# Patient Record
Sex: Female | Born: 1950 | Race: Black or African American | Hispanic: No | Marital: Married
Health system: Southern US, Community
[De-identification: ages and names within clinical notes are randomized; demographics above are authoritative.]

---

## 2004-08-03 ENCOUNTER — Ambulatory Visit (HOSPITAL_COMMUNITY): Admission: RE | Admit: 2004-08-03 | Discharge: 2004-08-03 | Payer: Self-pay | Admitting: Neurology

## 2004-08-13 ENCOUNTER — Inpatient Hospital Stay (HOSPITAL_COMMUNITY): Admission: RE | Admit: 2004-08-13 | Discharge: 2004-08-14 | Payer: Self-pay | Admitting: Specialist

## 2004-11-10 ENCOUNTER — Emergency Department (HOSPITAL_COMMUNITY): Admission: EM | Admit: 2004-11-10 | Discharge: 2004-11-10 | Payer: Self-pay | Admitting: Emergency Medicine

## 2006-01-29 ENCOUNTER — Emergency Department: Payer: Self-pay | Admitting: Emergency Medicine

## 2006-01-29 ENCOUNTER — Other Ambulatory Visit: Payer: Self-pay

## 2006-05-24 ENCOUNTER — Other Ambulatory Visit: Payer: Self-pay

## 2006-05-24 ENCOUNTER — Emergency Department: Payer: Self-pay | Admitting: Emergency Medicine

## 2006-10-15 ENCOUNTER — Emergency Department: Payer: Self-pay | Admitting: Emergency Medicine

## 2006-10-15 ENCOUNTER — Other Ambulatory Visit: Payer: Self-pay

## 2011-10-11 ENCOUNTER — Observation Stay: Payer: Self-pay | Admitting: Internal Medicine

## 2011-10-11 LAB — COMPREHENSIVE METABOLIC PANEL
Albumin: 3.4 g/dL (ref 3.4–5.0)
Alkaline Phosphatase: 89 U/L (ref 50–136)
Anion Gap: 9 (ref 7–16)
BUN: 9 mg/dL (ref 7–18)
Bilirubin,Total: 0.3 mg/dL (ref 0.2–1.0)
Calcium, Total: 8.3 mg/dL — ABNORMAL LOW (ref 8.5–10.1)
Chloride: 107 mmol/L (ref 98–107)
Co2: 25 mmol/L (ref 21–32)
Creatinine: 0.95 mg/dL (ref 0.60–1.30)
EGFR (African American): >60
EGFR (Non-African Amer.): >60
Glucose: 85 mg/dL (ref 65–99)
Osmolality: 279 (ref 275–301)
Potassium: 3.4 mmol/L — ABNORMAL LOW (ref 3.5–5.1)
SGOT(AST): 40 U/L — ABNORMAL HIGH (ref 15–37)
SGPT (ALT): 60 U/L
Sodium: 141 mmol/L (ref 136–145)
Total Protein: 7 g/dL (ref 6.4–8.2)

## 2011-10-11 LAB — CBC
HCT: 44 % (ref 35.0–47.0)
HGB: 13.9 g/dL (ref 12.0–16.0)
MCH: 29 pg (ref 26.0–34.0)
MCHC: 31.6 g/dL — ABNORMAL LOW (ref 32.0–36.0)
MCV: 92 fL (ref 80–100)
Platelet: 258 10*3/uL (ref 150–440)
RBC: 4.8 10*6/uL (ref 3.80–5.20)
RDW: 13.4 % (ref 11.5–14.5)
WBC: 14.1 10*3/uL — ABNORMAL HIGH (ref 3.6–11.0)

## 2011-10-11 LAB — URINALYSIS, COMPLETE
Bacteria: NONE SEEN
Ph: 6 (ref 4.5–8.0)
Protein: NEGATIVE
RBC,UR: 1 /HPF (ref 0–5)
Specific Gravity: 1.018 (ref 1.003–1.030)
WBC UR: 4 /HPF (ref 0–5)

## 2011-10-11 LAB — APTT: Activated PTT: 28 s

## 2011-10-11 LAB — PROTIME-INR: INR: 0.9

## 2011-10-12 LAB — MAGNESIUM: Magnesium: 2 mg/dL

## 2011-10-13 LAB — BASIC METABOLIC PANEL
Anion Gap: 9 (ref 7–16)
Calcium, Total: 9.2 mg/dL (ref 8.5–10.1)
Chloride: 103 mmol/L (ref 98–107)
EGFR (African American): >60
EGFR (Non-African Amer.): >60
Glucose: 91 mg/dL (ref 65–99)
Osmolality: 275 (ref 275–301)
Potassium: 3.5 mmol/L (ref 3.5–5.1)

## 2011-10-13 LAB — CBC WITH DIFFERENTIAL/PLATELET
Basophil #: 0 10*3/uL (ref 0.0–0.1)
Eosinophil #: 0.1 10*3/uL (ref 0.0–0.7)
Lymphocyte #: 2.3 10*3/uL (ref 1.0–3.6)
MCH: 30.4 pg (ref 26.0–34.0)
MCHC: 33.3 g/dL (ref 32.0–36.0)
Monocyte #: 0.6 x10 3/mm (ref 0.2–0.9)
Monocyte %: 7.8 %
Neutrophil #: 4.6 10*3/uL (ref 1.4–6.5)
Neutrophil %: 60 %
Platelet: 240 10*3/uL (ref 150–440)
RDW: 13 % (ref 11.5–14.5)

## 2011-10-13 LAB — MAGNESIUM: Magnesium: 1.9 mg/dL

## 2011-12-12 ENCOUNTER — Emergency Department: Payer: Self-pay | Admitting: Emergency Medicine

## 2013-06-20 DEATH — deceased

## 2013-11-07 IMAGING — CT CT HEAD WITHOUT CONTRAST
3 of 4 series · 17 of 30 positions shown, 19 images · non-contrast
Comparison: none

REASON FOR EXAM: seizure
COMMENTS:

PROCEDURE:     CT  - CT HEAD WITHOUT CONTRAST  - October 11, 2011  [DATE]
RESULT:     Head CT dated 10/11/2011 comparison made to prior study dated
05/24/2006.
TECHNIQUE: Helical noncontrasted 5 mm sections were obtained from skull base
to the vertex.

[Series 11: without 3 · axial · non-contrast · 0.46mm/px · z∈[-201,-81]mm · 5 of 36 slices shown (1 of 2)]
[im 6/36  brain]
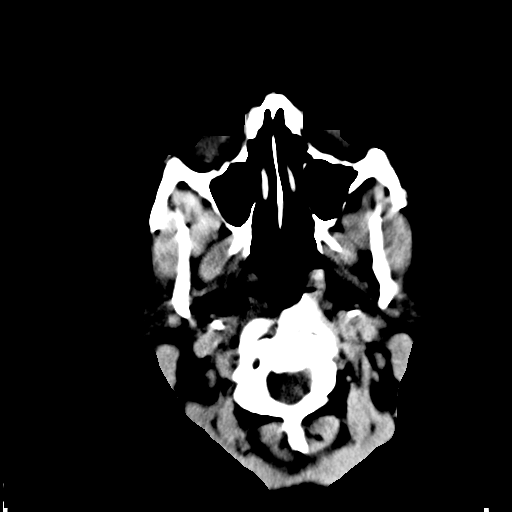
[im 12/36  brain]
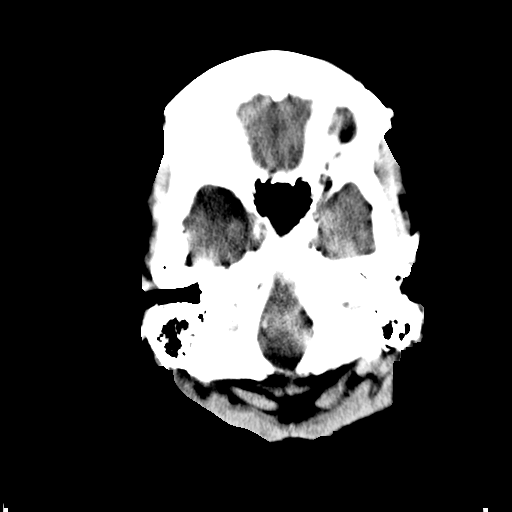
[im 18/36  brain]
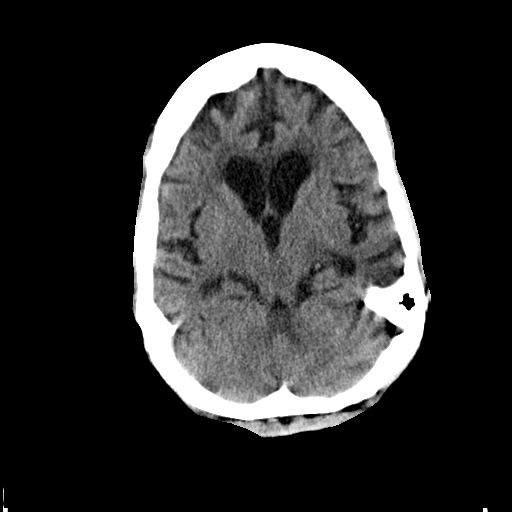
[im 24/36  brain]
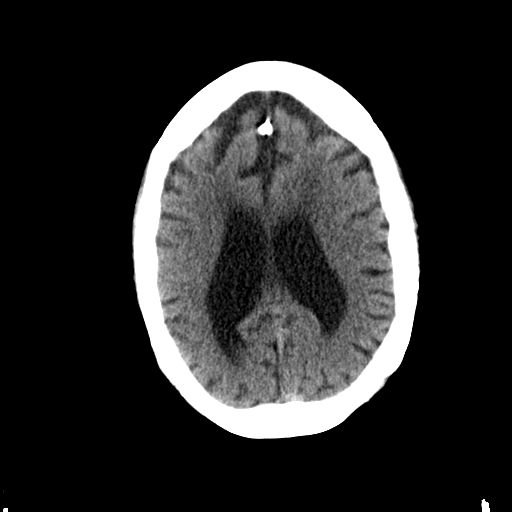
[im 30/36  brain]
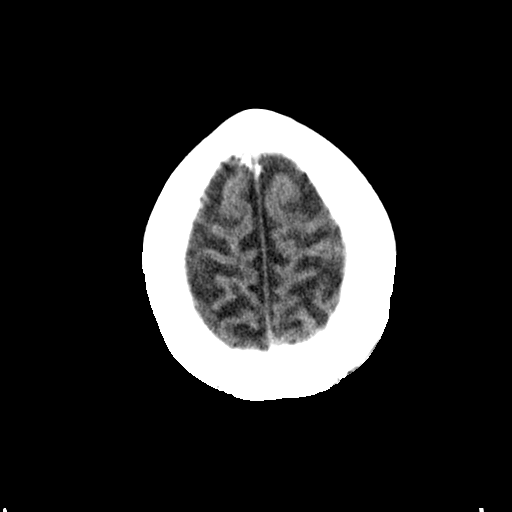

[Series 12: without 3 · axial · non-contrast · 0.46mm/px · z∈[-201,-76]mm · 6 of 36 slices shown, 8 images (2 of 2)]
[im 6/36  brain]
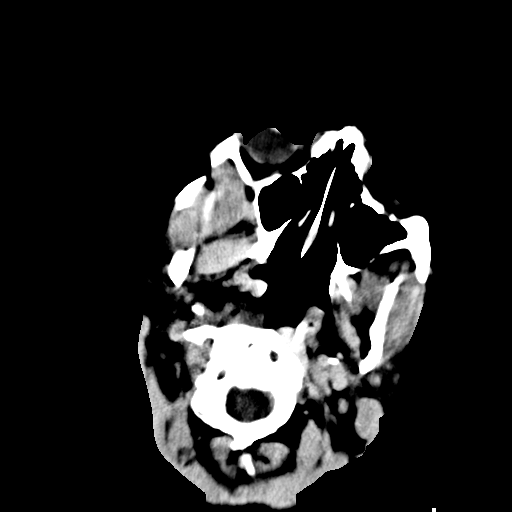
[im 6/36  bone]
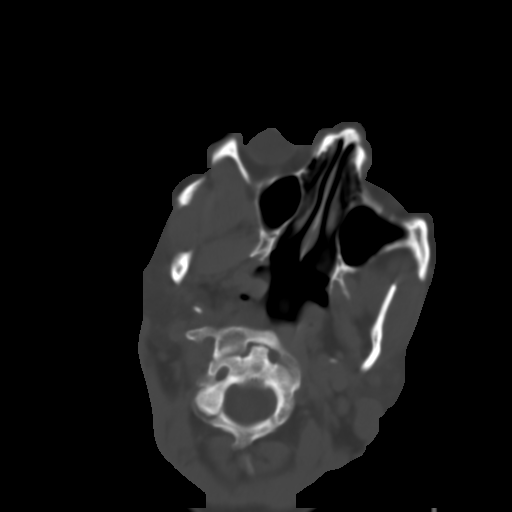
[im 11/36  brain]
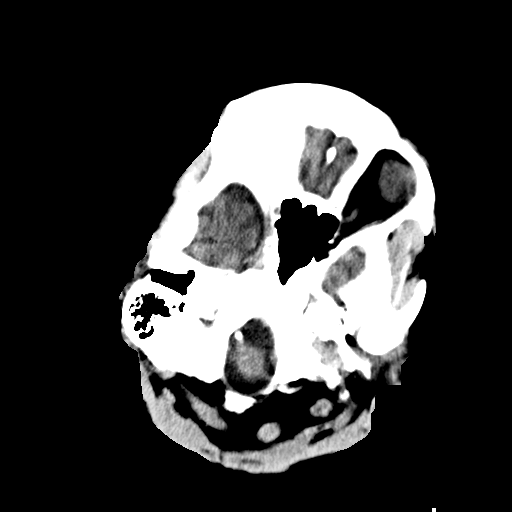
[im 16/36  brain]
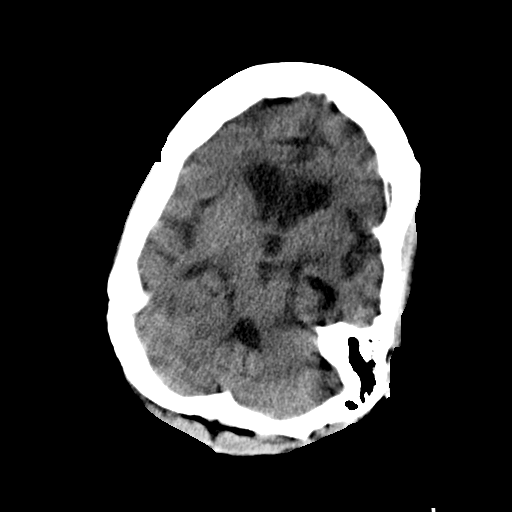
[im 21/36  brain]
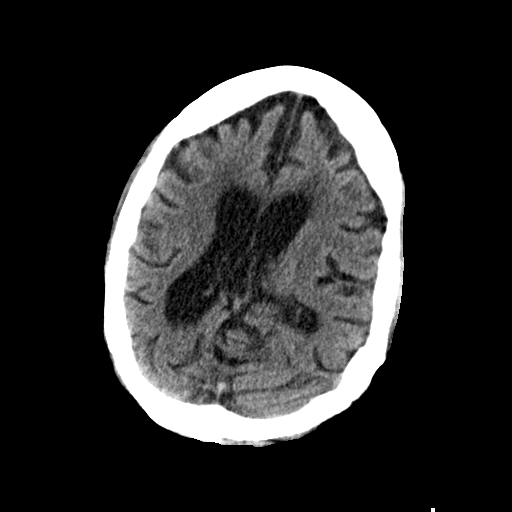
[im 26/36  brain]
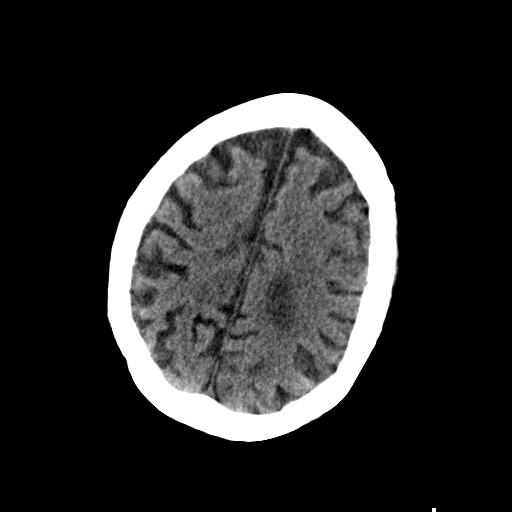
[im 26/36  bone]
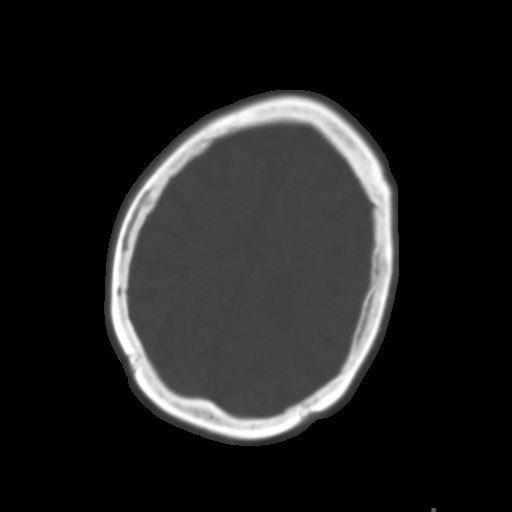
[im 31/36  brain]
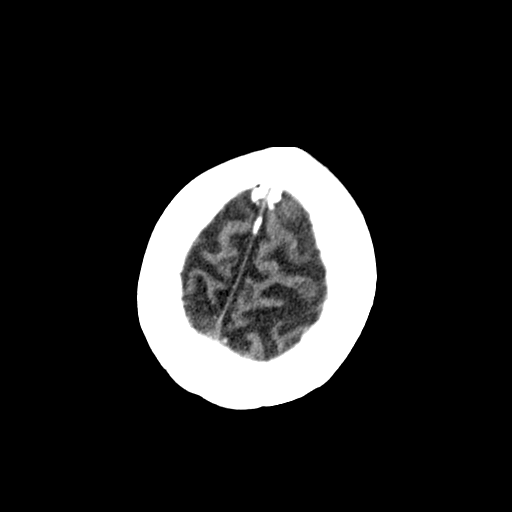

[Series 13: bone 3 · axial · 0.46mm/px · z∈[-201,-76]mm · 6 of 36 slices shown]
[im 6/36  bone]
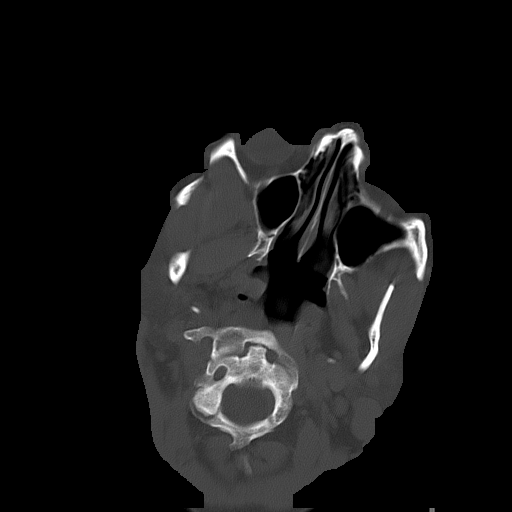
[im 11/36  bone]
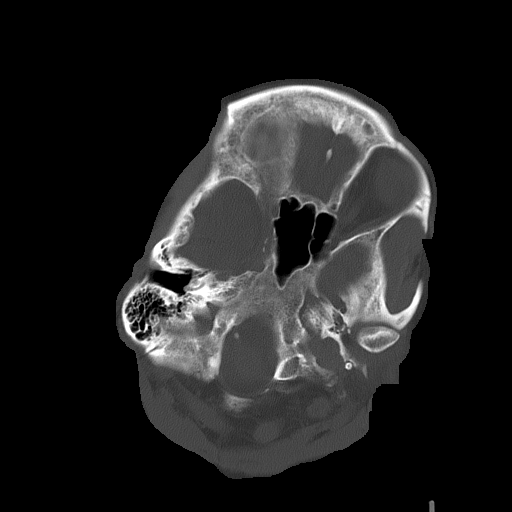
[im 16/36  bone]
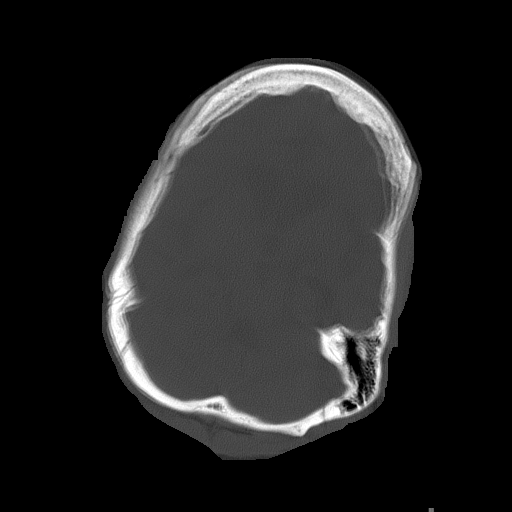
[im 21/36  bone]
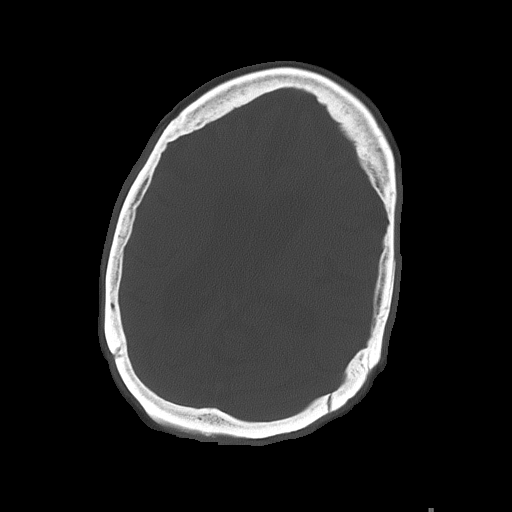
[im 26/36  bone]
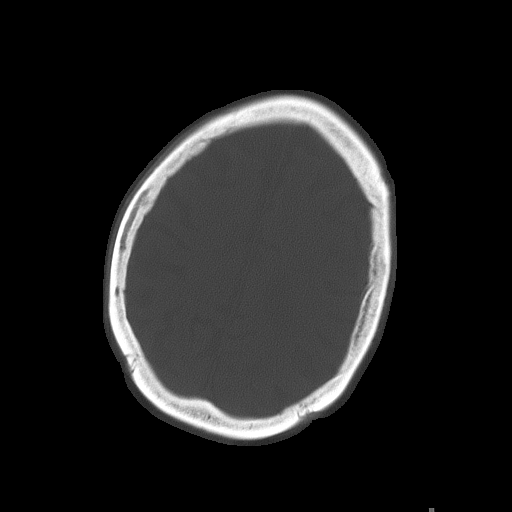
[im 31/36  bone]
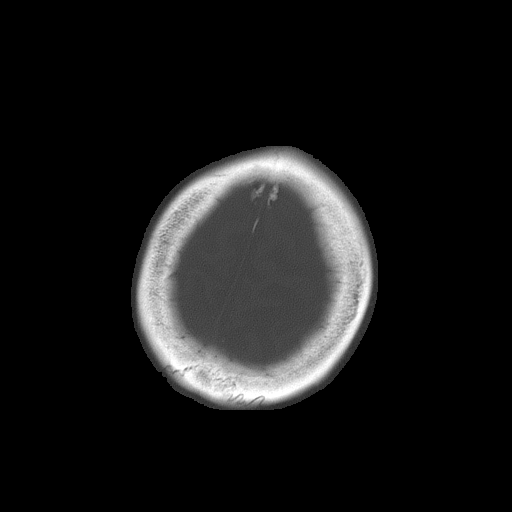

[17 of 30 positions shown; findings below may reference images not displayed]

FINDINGS: There is no evidence of intra-axial nor extra-axial fluid
collections, acute hemorrhage, mass effect, nor a depressed skull fracture.
The study is degraded by motion artifact. There is diffuse cerebral and
cerebellar atrophy and compensatory lateral ventricular enlargement. There
are diffuse areas of low-attenuation within the subcortical, deep, and
periventricular white matter regions. The visualized paranasal sinuses and
mastoid air cells are unremarkable.
IMPRESSION: Diffuse atrophy and small vessel white matter ischemic
changes. No evidence of acute abnormalities.

## 2014-01-08 IMAGING — CR RIGHT HIP - COMPLETE 2+ VIEW
1 series · 2 of 2 positions shown · non-contrast
Comparison: none

REASON FOR EXAM: fall; hip bruise/pain
COMMENTS:   LMP: Post Hysterectomy

PROCEDURE:     DXR - DXR HIP RIGHT COMPLETE  - December 12, 2011  [DATE]
RESULT:     AP and frog-leg lateral views of the right hip demonstrate some
degenerative subchondral sclerosis in the superior acetabular margin with
minimal spurring. There is no fracture, bony lesion or dislocation evident.

[Series 1: t hip ap right · 0.14mm/px · 2 of 2 slices shown]
[im 1/2]
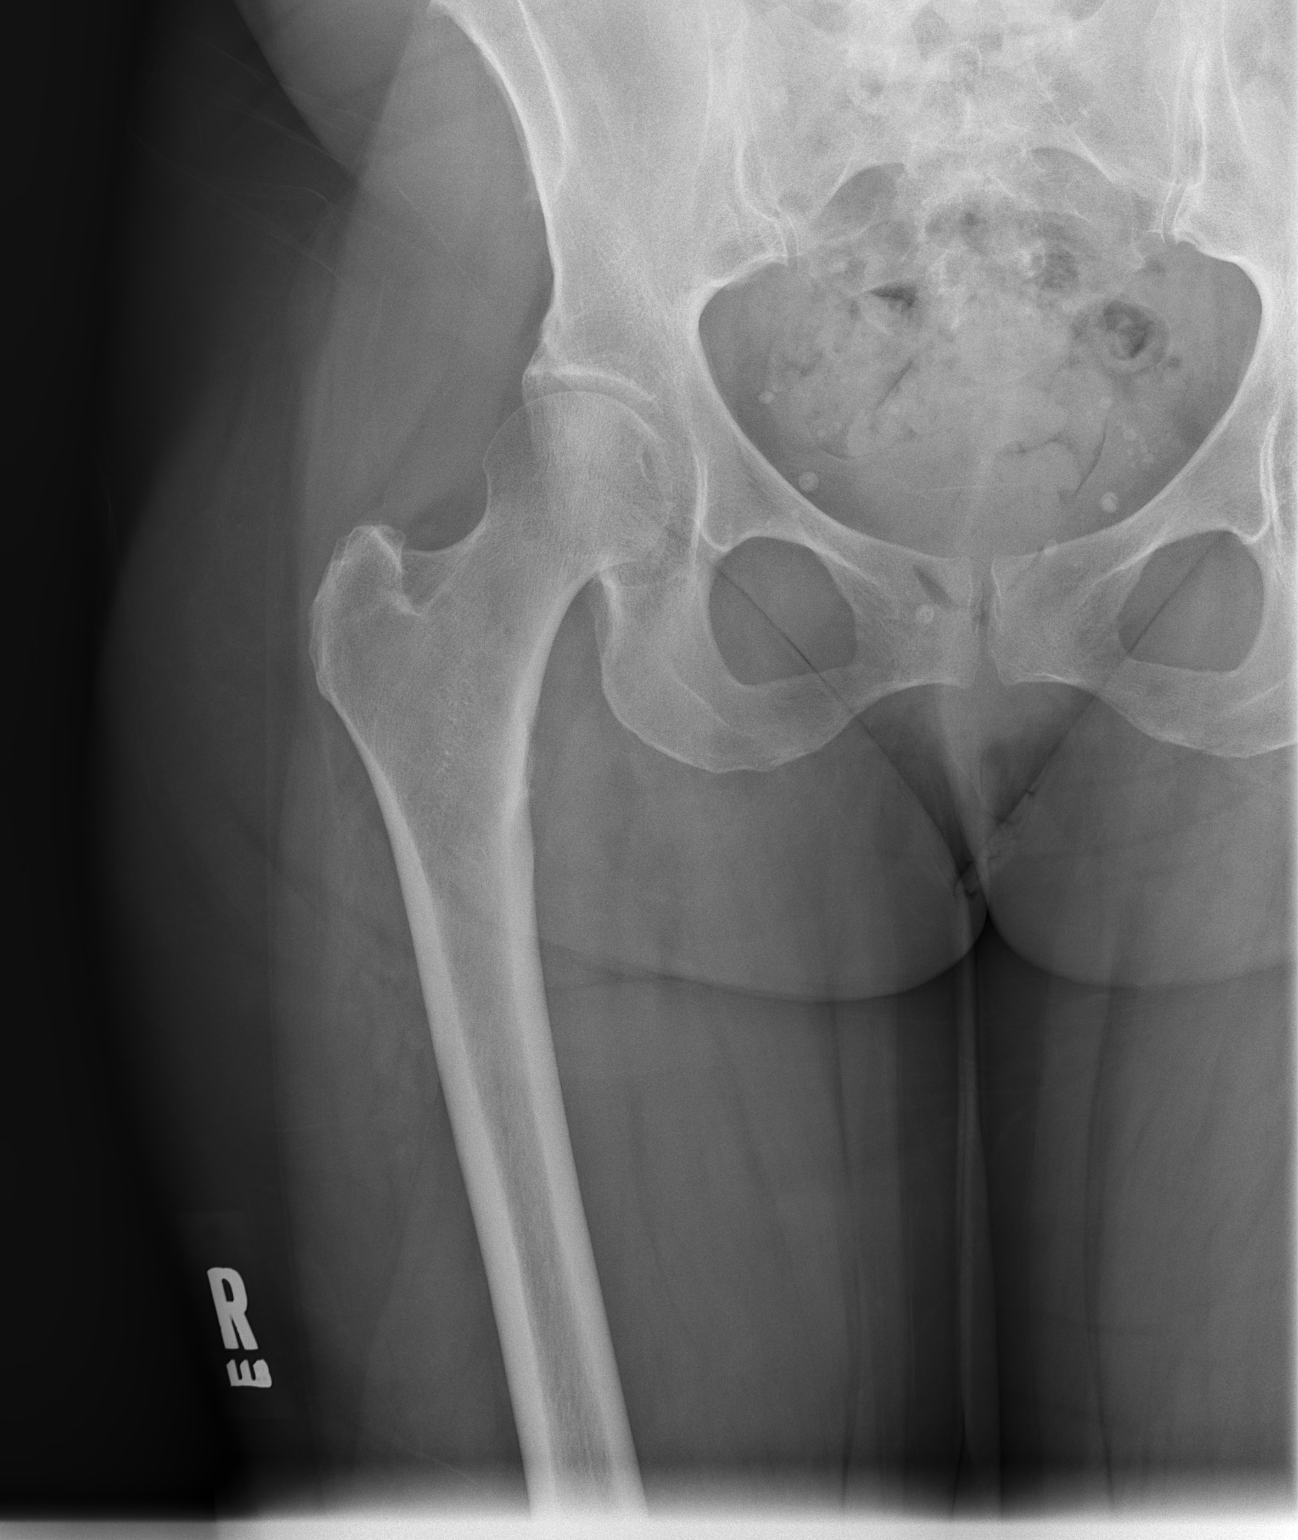
[im 2/2]
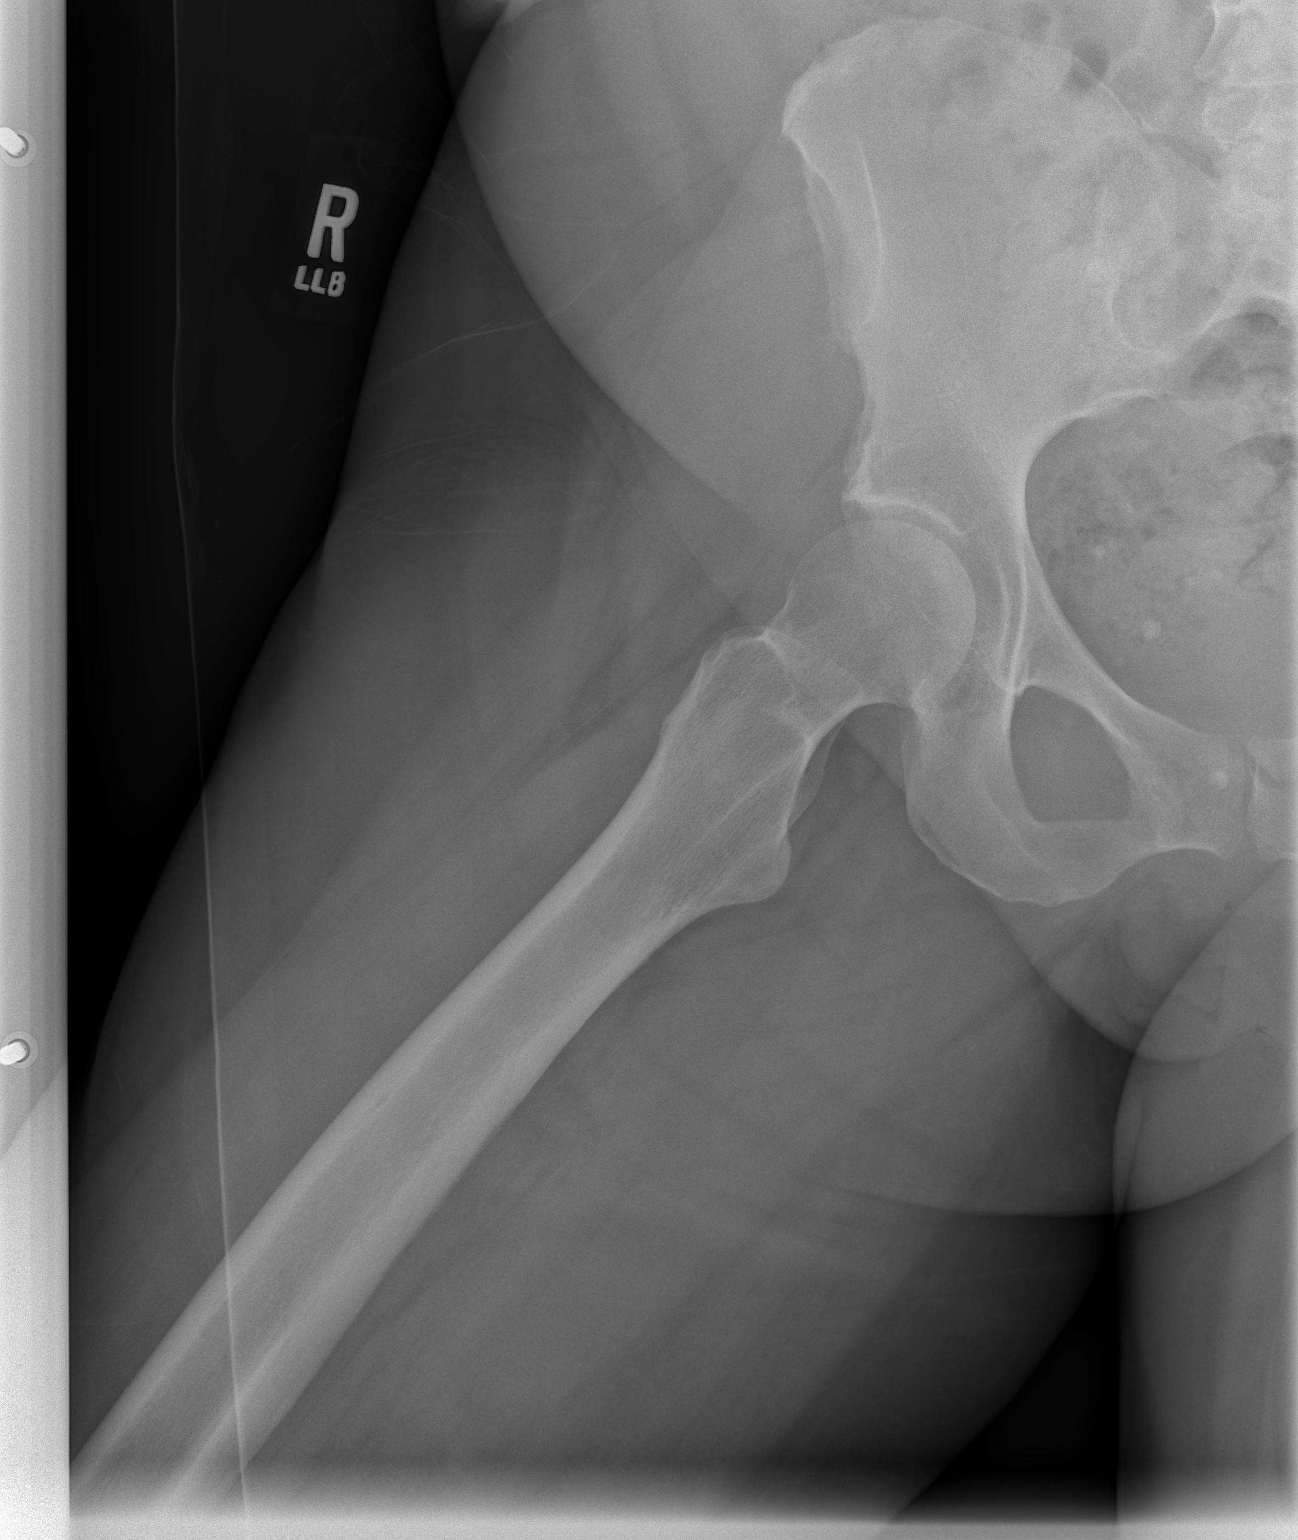

[2 of 2 positions shown; findings below may reference images not displayed]

IMPRESSION: 1.     No acute bony abnormality demonstrated.

[REDACTED]

## 2014-10-12 NOTE — Consult Note (Signed)
Referring Physician:  Henreitta Leber   Primary Care Physician:  [facility], [address] [place], [place] [address], [place] [phone]  Reason for Consult:  Admit Date: 11-Oct-2011   Chief Complaint: seizure   Reason for Consult: altered mental status; seizure   History of Present Illness:  History of Present Illness:   64 yo RHD AAF with hx of some type of dementia presents with witnessed seizure per husband then continued altered mental status.  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  Even though her medication was weaned, she still had a seizure.  She then had another seizure 6 months prior to now off medications without infection and then had another seizure yesterday without any signs of infection.  Per husband, her baseline is nonverbal, will follow intermittent commands with prompting and manages to sleep most of the day.  He performs all of her ADL's for her.  Due to condition, it is impossible to get any hx from patient.  ROS:   Review of Systems   unobtainable secondary to severe dementia  Past Medical/Surgical Hx:  hypertension:   dementia:   Past Medical/ Surgical Hx:   Past Medical History as above not Alzheimers though    Past Surgical History none   Home Medications: Medication Instructions Last Modified Date/Time  hydrochlorothiazide tablet 25 mg 1 tab(s) orally once a day  23-Apr-13 15:10  verapamil tablet, extended release 240 mg/24 hours 1 tab(s) orally once a day (at bedtime)  23-Apr-13 15:10   Home Medications:  Additional Home Medications: aricept and Namenda   Allergies:  Codeine: Anaphylaxis  Tylenol: Unknown  Social/Family History:  Employment Status: disabled   Lives With: spouse   Social History: no tob, no EtOH, no illicits   Family History: noncontributory   Physical  Exam:  General: resting in no acute distress, appropiate weight   HEENT: normocephalic, sclera nonicteric, oropharynx clear   Neck: supple, no JVD, no bruits   Chest: CTA B, no wheezes, good movement   Cardiac: RRR, no murmurs, 2+ pulses   Extremities: no C/C/E, FROM   Neurologic Exam:  Mental Status: nonverbal, does not follow, rare verbal output, localizes   Cranial Nerves: PERRLA, walled-eye deformity, nl VF to threat, nl Dolls, good corneals and gag, face symmetric   Motor Exam: moves all 4 ext equally, increased tone R >L   Deep Tendon Reflexes: 2+ bilateral, withdrawal B plantars   Sensory Exam: grimaces and localizes equally to pain   Coordination: untestable   Cerebellar Exam: untestable   Gait: untestable   Lab Results: Hepatic:  23-Apr-13 16:12    Bilirubin, Total 0.3   Alkaline Phosphatase 89   SGPT (ALT) 60   SGOT (AST)   40   Total Protein, Serum 7.0   Albumin, Serum 3.4  Routine Chem:  23-Apr-13 16:12    Glucose, Serum 85   BUN 9   Creatinine (comp) 0.95   Sodium, Serum 141   Chloride, Serum 107   CO2, Serum 25   Calcium (Total), Serum   8.3   Osmolality (calc) 279   eGFR (African American) >60   eGFR (Non-African American) >60   Anion Gap 9  24-Apr-13 04:37    Potassium, Serum   3.4  Routine UA:  23-Apr-13 17:13    Color (UA) Yellow  Clarity (UA) Hazy   Glucose (UA) Negative   Bilirubin (UA) Negative   Ketones (UA) Negative   Specific Gravity (UA) 1.018   Blood (UA) Negative   pH (UA) 6.0   Protein (UA) Negative   Nitrite (UA) Negative   Leukocyte Esterase (UA) Trace   RBC (UA) 1 /HPF   WBC (UA) 4 /HPF   Epithelial Cells (UA) 2 /HPF   Mucous (UA) PRESENT  Routine Coag:  23-Apr-13 17:35    Activated PTT (APTT) 28.0   Prothrombin 12.9   INR 0.9  Routine Hem:  23-Apr-13 16:12    WBC (CBC)   14.1   RBC (CBC) 4.80   Hemoglobin (CBC) 13.9   Hematocrit (CBC) 44.0   Platelet Count (CBC) 258   MCV 92   MCH 29.0   MCHC   31.6   RDW 13.4    Radiology Results: CT:    23-Apr-13 17:06, CT Head Without Contrast   CT Head Without Contrast    REASON FOR EXAM:    seizure  COMMENTS:       PROCEDURE: CT  - CT HEAD WITHOUT CONTRAST  - Oct 11 2011  5:06PM     RESULT: Head CT dated 10/11/2011 comparison made to prior study dated   05/24/2006.    Technique: Helical noncontrasted 5 mm sections wereobtained from skull   base to the vertex.    Findings: There is no evidence of intra-axial nor extra-axial fluid   collections, acute hemorrhage, mass effect, nor a depressed skull   fracture. The study is degraded by motion artifact. There is diffuse   cerebral and cerebellar atrophy and compensatory lateral ventricular     enlargement. There are diffuse areas of low-attenuation within the   subcortical, deep, and periventricular white matter regions. The   visualized paranasal sinuses and mastoid air cells are unremarkable.    IMPRESSION:  Diffuse atrophy and small vessel white matter ischemic   changes. No evidence of acute abnormalities.          Verified By: Mikki Santee, M.D., MD   Impression/Recommendations:  Recommendations:   CT of head w/o contrast personally reviewed by me and shows extensive atrophy out of proportion for someone this age and also shows a moderate degree of white matter changes. discussed with referring physician  Seizure-  pt has probable epilepsy given the fact that she has had three seizures of medications and the medication probably prevented them prior.  Dementia can lower seizure threshold.  There were no provoking factors Dementia-  this is very unusual for Alheizmers due to age of onset and pt's clinical picture.  This could be vascular in nature or some other neurodegenerative disorder.  Doubt prion dz due to length of onset.  Consider frontotemporal dementia such as primary progressive aphasia as cause. Hypertension-  controlled  EEG as inpatient will start low dose Keppra 523m BID, after  discussion with husband check lipids, TSH, Hem A1c, B12 start ASA 859mdaily anticipate d/c tomorrow if pt tolerates Keppra, by history she will need lifelong antiepileptics will follow  Electronic Signatures: SmJamison NeighborMD)  (Signed 24-Apr-13 10:30)  Authored: REFERRING PHYSICIAN, Primary Care Physician, Consult, History of Present Illness, Review of Systems, PAST MEDICAL/SURGICAL HISTORY, HOME MEDICATIONS, ALLERGIES, Social/Family History, Physical Exam-, LAB RESULTS, RADIOLOGY RESULTS, Recommendations   Last Updated: 24-Apr-13 10:30 by SmJamison NeighborMD)

## 2014-10-12 NOTE — Discharge Summary (Signed)
PATIENT NAME:  Jaime Hull, EHLER MR#:  [id number] DATE OF BIRTH:  [birth year]  DATE OF ADMISSION:  10/11/2011 DATE OF DISCHARGE:  10/13/2011   PRIMARY CARE PHYSICIAN: Dr. Loreta Ave at [facility]   PRIMARY NEUROLOGIST: Dr. Laurell Josephs, [facility]   REASON FOR ADMISSION: Altered mental status and seizure.   DISCHARGE DIAGNOSES:  1. Seizures/altered mental status with abnormal EEG, probable epilepsy (however, EEG also noted a lot of artifact and generalized slowing). The patient will need lifelong antiepileptic therapy.  2. Hypokalemia, resolved.  3. Leukocytosis, stress induced, resolved.  4. History of dementia (possibly vascular versus frontotemporal versus other neurodegenerative disorder, less likely Alzheimer's).  5. History of hypertension. 6. History of seizures in the past.   CONSULT: Neurology with Dr. Mellody Drown    DISCHARGE DISPOSITION: Home under the care of her husband. The patient also receives care from a daily aide.   DISCHARGE MEDICATIONS:  1. Keppra 500 mg p.o. b.i.d.  2. Aspirin 81 mg p.o. daily. 3. Multivitamin 1 tablet p.o. daily.  4. HCTZ 25 mg daily. 5. Verapamil extended-release 240 mg p.o. daily.   DISCHARGE CONDITION: Improved, stable.   DISCHARGE ACTIVITY: As tolerated.   DISCHARGE DIET: Low sodium.   DISCHARGE INSTRUCTIONS:  1. Take medications as prescribed.  2. Return to Emergency Department for recurrence of symptoms or for development of seizures.  FOLLOW-UP INSTRUCTIONS:  1. Follow-up with Dr. Loreta Ave at [facility] within 1 to 2 weeks.  2. Follow-up with your neurologist at [facility], Dr. Laurell Josephs, within one week.   PROCEDURES:  1. Chest x-ray, PA and lateral, 10/11/2011, no acute cardiopulmonary abnormalities are noted.  2. Noncontrast head CT 10/11/2011 diffuse atrophy and small vessel white matter ischemic changes. No acute intracranial abnormalities are noted.  3. EEG 10/12/2011 markedly abnormal EEG due to multiple epileptic runs and continuous  generalized slowing of the background, however, this EEG is limited secondary to a great deal of artifact.   PERTINENT LABORATORY DATA: Serum potassium 3.4 on admission; 3.5 on the day of discharge, otherwise BMP was normal on admission. CBC normal on admission except for WBC 14.1; WBC 7.7 on the day of discharge. Urinalysis was unremarkable.   BRIEF HISTORY/HOSPITAL COURSE: The patient is a 64 year old female with past medical history of severe dementia, hypertension, and questionable seizures in the past who presented to the Emergency Department secondary to altered mental status and a seizure episode which was witnessed by her husband. Please see dictated admission history and physical for pertinent details surrounding the onset of this hospitalization. Please see below for further details.  1. Seizures/altered mental status witnessed by the patient's husband. The patient was postictal on arrival. Noncontrast head CT was obtained and was negative for acute intracranial abnormalities. Thereafter, the patient was admitted to the Observation Unit for further evaluation and management. Initially she was not placed on any antiepileptic therapy. EEG was ordered as well as Neurology consult. The patient was seen and examined by Dr. Mellody Drown of Neurology who recommended initiation of antiepileptic therapy. Apparently the patient had experienced seizures in the past after she was taken off Depakote which is possibly being used as a mood stabilizer for possible bipolar disorder. However, this was later stopped after which point the patient experienced seizures. She presented again with another seizure on this occasion. EEG was abnormal and revealed multiple epileptic runs as well as continued generalized slowing but there was also a great deal of artifact. Dr. Mellody Drown feels that the patient likely has epilepsy and recommended  lifelong antiepileptic therapy for which she has been started on Keppra. Postictal  state has resolved. She is tolerating Keppra well. Her mental status is back to baseline at the time of discharge. The patient will need close outpatient follow-up with her primary care physician as well as her primary neurologist, Dr. La[facility]ll [facility]sBurke, at [facility]. Otherwise, no further testing or studies are recommended at this time per Dr. Mellody DrownMatthew Smith of Neurology.  2. Dementia for which the patient was also seen by Neurology and Dr. Katrinka BlazingSmith felt this could be vascular in nature versus frontotemporal dementia versus some other neurodegenerative disorder and Dr. Katrinka BlazingSmith felt that the age of onset was unusual for her and did not fit usual presentation of Alzheimer's dementia. In the event of vascular dementia, she has been started on aspirin therapy. She will need close outpatient follow-up with her primary neurologist, [facility].  3. Hypertension. Blood pressures remained well controlled. The patient continued verapamil and HCTZ. 4. Hypokalemia. This was likely due to HCTZ and after giving her supplemental potassium chloride her serum potassium level has normalized. Magnesium level was also normal.  5. Leukocytosis, felt to be stress induced from seizure. The patient has been afebrile and there is no obvious source of infection. Chest x-ray was negative for pneumonia and urinalysis was not indicative of urinary tract infection. She has remained afebrile and WBC count has normalized. The patient has no leukocytosis at the time of discharge.   On 10/13/2011 the patient was hemodynamically stable without any seizures, her mental status was at baseline, and she was felt to be stable for discharge home under the care of her husband in addition to receiving care from a daily aide. She was felt to be stable for discharge home with close outpatient follow-up. The patient's husband was agreeable to this.  TIME SPENT ON DISCHARGE: Greater than 30 minutes.   ____________________________ Elon AlasKamran N. Jakel Alphin,  MD knl:drc D: 10/17/2011 13:41:35 ET T: 10/18/2011 10:04:15 ET JOB#: [id number]  cc: Elon AlasKamran N. Foxx Klarich, MD, <Dictator> [facility]. [facility]Mann at [facility]  [facility], Dr. Jake ChurchBurke  Deysha Cartier N Derelle Cockrell MD ELECTRONICALLY SIGNED 10/24/2011 15:09

## 2014-10-12 NOTE — H&P (Signed)
PATIENT NAME:  Jaime Hull, Jaime Hull MR#:  [id number] DATE OF BIRTH:  Jan 18, 1951  DATE OF ADMISSION:  10/11/2011  PRIMARY CARE PHYSICIAN: Nonlocally located.   CHIEF COMPLAINT: Altered mental status/seizures.   HISTORY OF PRESENT ILLNESS: This is a 64 year old female who came into the Emergency Room brought in by her husband due to altered mental status/seizures. As per the husband who gives most of the history as patient has underlying dementia, patient this morning around 4:00 a.m. had an episode of a seizure. He describes the episode as being where her eyes rolled backwards and she became fairly stiff and then she was shaking. She normally has an underlying baseline tremor but this was much worse. Patient has been lethargic since then and therefore he brought her to the ER for further evaluation. As per the husband patient did have a seizure about three years ago when she was taken off Depakote. Since then she has had no epileptic activity. As per the husband patient's mental status is currently not back down to baseline. Hospitalist service was then contacted for further treatment evaluation.   REVIEW OF SYSTEMS: Otherwise unobtainable given the patient's severe dementia.   SOCIAL HISTORY: No smoking. No alcohol abuse. No illicit drug abuse. Lives at home with her husband.   ALLERGIES: Codeine and Tylenol.   FAMILY HISTORY: Significant for a bipolar disorder on the father's side of the family. Mother died from complications of heart disease.   CURRENT MEDICATIONS:  1. Hydrochlorothiazide 25 mg daily.  2. Verapamil 240 mg daily.   PHYSICAL EXAMINATION:  VITAL SIGNS: Patient's vital signs are noted to be: Temperature 96.3, pulse 95, respirations 20, blood pressure 120/70, sats 94% on room air.   GENERAL: She is a pleasant but anxious appearing female with underlying baseline tremor but in no apparent distress.   HEENT: Atraumatic, normocephalic. Her extraocular muscles are intact. Pupils equal,  reactive to light. Sclerae anicteric. No conjunctival injection. No oropharyngeal erythema.   NECK: Supple. There is no jugular venous distention, no bruits, no lymphadenopathy or thyromegaly.   HEART: Regular rate and rhythm. No murmurs, no rubs, no clicks.   LUNGS: Clear to auscultation bilaterally. No rales, no rhonchi, no wheezes.   ABDOMEN: Soft, flat, nontender, nondistended. Has good bowel sounds. No hepatosplenomegaly appreciated.   EXTREMITIES: No evidence of any cyanosis, clubbing, or peripheral edema. Has +2 pedal and radial pulses bilaterally.   NEUROLOGIC: She is alert, awake, oriented x1. Difficult to do a full neurological exam. She has underlying baseline tremor. She also has trismus. But no other focal motor or sensory deficits appreciated.   SKIN: Moist, warm with no rashes.   LYMPHATIC: There is no cervical or axillary lymphadenopathy.   LABORATORY, DIAGNOSTIC, AND RADIOLOGICAL DATA: Serum glucose 85, BUN 9, creatinine 0.9, sodium 141, potassium 3.4, chloride 107, bicarbonate 25. LFTs are within normal limits. White cell count 14.1, hemoglobin 13.9, hematocrit 44, platelet count 258. Prothrombin time 12.9, INR 0.9. Urinalysis is within normal limits.   Patient did have a CT of the head done which showed diffuse atrophy and small vessel white matter ischemic changes. No acute abnormalities noted.     ASSESSMENT AND PLAN: This is a 64 year old female with history of Alzheimer's dementia, hypertension, history of questionable seizures presents to the hospital due to altered mental status and a seizure episode.  1. Altered mental status/seizure. Patient apparently had observed seizure by her husband. Her last seizure was about three years ago. She is currently not taking any antiepileptics. She is  slightly postictal presently. Patient normally follows up with Dr. Laurell Josephs from neurology at [facility]. For now I will observe the patient overnight on the off unit telemetry, place her on  seizure precautions. Will not start antiepileptics now. Patient's husband wants to discuss treatment with her neurologist as she tends to react fairly badly with certain medications. Will get a neurology consult in the morning. As mentioned CT head is currently negative. Will also place a sitter.  2. Hypertension. Continue hydrochlorothiazide and verapamil.  3. Hypokalemia. I will place her on some potassium supplements and repeat potassium in the morning.  4. CODE STATUS: Patient is a FULL CODE.   TIME SPENT: 50 minutes.   ____________________________ Rolly Pancake. Cherlynn Kaiser, MD vjs:cms D: 10/11/2011 20:05:45 ET T: 10/12/2011 06:39:48 ET JOB#: [id number]  cc: Rolly Pancake. Cherlynn Kaiser, MD, <Dictator> Houston Siren MD ELECTRONICALLY SIGNED 10/15/2011 7:54
